# Patient Record
Sex: Male | Born: 1985 | Race: White | Hispanic: No | Marital: Single | Smoking: Current every day smoker
Health system: Western US, Academic
[De-identification: ages and names within clinical notes are randomized; demographics above are authoritative.]

## PROBLEM LIST (undated history)

## (undated) DIAGNOSIS — F1910 Other psychoactive substance abuse, uncomplicated: Secondary | ICD-10-CM

---
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
## 2011-04-10 ENCOUNTER — Encounter (HOSPITAL_COMMUNITY): Payer: Self-pay | Admitting: *Deleted

## 2011-04-10 ENCOUNTER — Emergency Department (HOSPITAL_COMMUNITY)
Admission: EM | Admit: 2011-04-10 | Discharge: 2011-04-11 | Disposition: A | Discharge status: Home / Self Care | Payer: No Typology Code available for payment source | Attending: Emergency Medicine | Admitting: Emergency Medicine

## 2011-04-10 ENCOUNTER — Emergency Department (HOSPITAL_COMMUNITY): Payer: No Typology Code available for payment source

## 2011-04-10 DIAGNOSIS — M542 Cervicalgia: Secondary | ICD-10-CM | POA: Insufficient documentation

## 2011-04-10 DIAGNOSIS — M79609 Pain in unspecified limb: Secondary | ICD-10-CM | POA: Insufficient documentation

## 2011-04-10 DIAGNOSIS — F1010 Alcohol abuse, uncomplicated: Secondary | ICD-10-CM | POA: Insufficient documentation

## 2011-04-10 DIAGNOSIS — R404 Transient alteration of awareness: Secondary | ICD-10-CM | POA: Insufficient documentation

## 2011-04-10 DIAGNOSIS — M546 Pain in thoracic spine: Secondary | ICD-10-CM | POA: Insufficient documentation

## 2011-04-10 LAB — ETHANOL: Alcohol, Ethyl (B): 42 mg/dL — ABNORMAL HIGH (ref 0–11)

## 2011-04-10 LAB — POCT I-STAT, CHEM 8
BUN: 13 mg/dL (ref 6–23)
Hemoglobin: 16.3 g/dL (ref 13.0–17.0)
Potassium: 3.2 mEq/L — ABNORMAL LOW (ref 3.5–5.1)
Sodium: 142 mEq/L (ref 135–145)
TCO2: 24 mmol/L (ref 0–100)

## 2011-04-10 LAB — CBC
HCT: 45.7 % (ref 39.0–52.0)
Hemoglobin: 16.7 g/dL (ref 13.0–17.0)
MCHC: 36.5 g/dL — ABNORMAL HIGH (ref 30.0–36.0)
WBC: 8.1 10*3/uL (ref 4.0–10.5)

## 2011-04-10 MED ORDER — SODIUM CHLORIDE 0.9 % IV BOLUS (SEPSIS)
1000.0000 mL | Freq: Once | INTRAVENOUS | Status: AC
  Administered 2011-04-10: 1000 mL via INTRAVENOUS

## 2011-04-10 MED ORDER — MORPHINE SULFATE 4 MG/ML IJ SOLN
4.0000 mg | Freq: Once | INTRAMUSCULAR | Status: AC
  Administered 2011-04-10: 4 mg via INTRAVENOUS
  Filled 2011-04-10: qty 1
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
## 2011-04-11 MED ORDER — OXYCODONE-ACETAMINOPHEN 5-325 MG PO TABS: 1.0000 | ORAL_TABLET | Freq: Four times a day (QID) | ORAL | Status: AC | PRN

## 2011-04-11 MED ORDER — IBUPROFEN 600 MG PO TABS: 600.0000 mg | ORAL_TABLET | Freq: Four times a day (QID) | ORAL | Status: AC | PRN

## 2011-04-11 NOTE — ED Provider Notes (Signed)
I saw and evaluated the patient, reviewed the resident's note and I agree with the findings and plan.  Cyndra Numbers, MD 04/11/11 [phone]
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
## 2013-07-28 ENCOUNTER — Encounter: Payer: Self-pay | Admitting: Family Medicine

## 2013-07-28 ENCOUNTER — Emergency Department
Admission: EM | Admit: 2013-07-28 | Discharge: 2013-07-28 | Disposition: A | Discharge status: Home / Self Care | Payer: MEDICAID

## 2013-07-28 ENCOUNTER — Emergency Department (EMERGENCY_DEPARTMENT_HOSPITAL): Payer: MEDICAID

## 2013-07-28 DIAGNOSIS — Z0389 Encounter for observation for other suspected diseases and conditions ruled out: Secondary | ICD-10-CM

## 2013-07-28 DIAGNOSIS — Y929 Unspecified place or not applicable: Secondary | ICD-10-CM | POA: Insufficient documentation

## 2013-07-28 DIAGNOSIS — R4182 Altered mental status, unspecified: Principal | ICD-10-CM | POA: Insufficient documentation

## 2013-07-28 DIAGNOSIS — T401X4A Poisoning by heroin, undetermined, initial encounter: Secondary | ICD-10-CM | POA: Insufficient documentation

## 2013-07-28 DIAGNOSIS — T394X1A Poisoning by antirheumatics, not elsewhere classified, accidental (unintentional), initial encounter: Secondary | ICD-10-CM | POA: Insufficient documentation

## 2013-07-28 DIAGNOSIS — T401X1A Poisoning by heroin, accidental (unintentional), initial encounter: Secondary | ICD-10-CM | POA: Insufficient documentation

## 2013-07-28 HISTORY — DX: Other psychoactive substance abuse, uncomplicated: F19.10

## 2013-07-28 LAB — CBC WITH DIFFERENTIAL
BASOPHILS % AUTO: 0.8 %
BASOPHILS ABS AUTO: 0.1 10*3/uL (ref 0–0.2)
EOSINOPHIL % AUTO: 0.9 %
EOSINOPHIL ABS AUTO: 0.1 10*3/uL (ref 0–0.5)
HEMATOCRIT: 38.8 % — AB (ref 40–52)
HEMOGLOBIN: 13 g/dL — AB (ref 14.0–18.0)
LYMPHOCYTE ABS AUTO: 1.8 10*3/uL (ref 1.0–4.8)
LYMPHOCYTES % AUTO: 23.7 %
MCH: 29.6 pg (ref 27–33)
MCHC: 33.4 % (ref 32–36)
MCV: 88.7 UM3 (ref 80–100)
MONOCYTES % AUTO: 8.4 %
MONOCYTES ABS AUTO: 0.6 10*3/uL (ref 0.1–0.8)
MPV: 7.6 UM3 (ref 6.8–10.0)
NEUTROPHIL ABS AUTO: 5.1 10*3/uL (ref 1.80–7.70)
NEUTROPHILS % AUTO: 66.2 %
PLATELET COUNT: 274 10*3/uL (ref 130–400)
RDW: 15.1 U — AB (ref 0–14.7)
RED CELL COUNT: 4.38 10*6/uL — AB (ref 4.5–5.9)
WHITE BLOOD CELL COUNT: 7.8 10*3/uL (ref 4.5–11.0)

## 2013-07-28 LAB — BASIC METABOLIC PANEL
CALCIUM: 8.8 mg/dL (ref 8.6–10.5)
CARBON DIOXIDE TOTAL: 26 meq/L (ref 24–32)
CHLORIDE: 107 meq/L (ref 95–110)
CREATININE BLOOD: 0.86 mg/dL (ref 0.44–1.27)
GLUCOSE: 107 mg/dL — AB (ref 70–99)
POTASSIUM: 3.6 meq/L (ref 3.3–5.0)
SODIUM: 139 meq/L (ref 135–145)
UREA NITROGEN, BLOOD (BUN): 12 mg/dL (ref 8–22)

## 2013-07-28 LAB — SALICYLATE,BLOOD: SALICYLATE,BLOOD: NEGATIVE mg/dL

## 2013-07-28 LAB — HEPATIC FUNCTION PANEL
ALANINE TRANSFERASE (ALT): 36 U/L (ref 6–63)
ALBUMIN: 4.3 g/dL (ref 3.4–4.8)
ALKALINE PHOSPHATASE (ALP): 81 U/L (ref 35–115)
ASPARTATE TRANSAMINASE (AST): 29 U/L (ref 15–43)
BILIRUBIN DIRECT: 0.2 mg/dL (ref 0.0–0.2)
BILIRUBIN TOTAL: 1.1 mg/dL (ref 0.3–1.3)
PROTEIN: 7.1 g/dL (ref 6.3–8.3)

## 2013-07-28 LAB — ACETAMINOPHEN

## 2013-07-28 MED ORDER — NACL 0.9% IV BOLUS - DURATION REQ
1000.0000 mL | Freq: Once | INTRAVENOUS | Status: DC
Start: 2013-07-28 — End: 2013-07-28

## 2013-07-28 NOTE — ED Triage Note (Signed)
Pt drowsy, biba s/p took unk amount of heroin and went unresponsive.  Pt given narcan 2mg  with +results.  Pt still drowsy.  To room.

## 2013-07-28 NOTE — ED Nursing Note (Signed)
Assumed care and report from carol for shift relief. He is awake, talkative and significant other at bedside. Says he is tired and behind on sleep. Requesting something to eat/drink.

## 2013-07-28 NOTE — ED Nursing Note (Signed)
Patients girlfriend here and says they live in a car and that her mom is going to pay for his re hab tomorrow

## 2013-07-28 NOTE — ED Nursing Note (Signed)
Report from schwapna rn.  Patient is awake and alert, perla 2mm, reactive, oriented states he is out of jail x 2 days and homeless.  He has been doing heroin x 2 days
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
## 2013-07-28 NOTE — ED Nursing Note (Signed)
Sleeping, color is good and sats 99% arouses easily to voice, has been taking po ice water

## 2013-07-28 NOTE — ED Nursing Note (Signed)
Stable, alert, ambulatory and steady gait with normal mentation and attentiveness. Leaving with significant other.

## 2013-07-28 NOTE — ED Nursing Note (Signed)
Patient voids 300cc dark yellow urine, ua tox to the lab, taking po water well, dozes at times

## 2013-07-28 NOTE — ED Nursing Note (Signed)
12 lead ekg done
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
## 2013-07-29 LAB — UR DRUGS COMPREHENSIVE
AMPHETAMINE SCREEN, URINE: POSITIVE ng/mL — AB
BARBITURATES SCREEN, URINE: NEGATIVE ng/mL
BENZODIAZEPINES SCREEN, URINE: NEGATIVE ng/mL
COCAINE METABOLITE SCRN, URINE: NEGATIVE ng/mL
OPIATES SCREEN, URINE: POSITIVE ng/mL — AB
UR DRUGS BY GC: POSITIVE — AB

## 2013-07-29 LAB — URINE GC CONFIRMATION

## 2013-07-29 LAB — URINE GCMS CONFIRMATION

## 2013-07-30 DIAGNOSIS — R000 Tachycardia, unspecified: Secondary | ICD-10-CM

## 2013-07-30 DIAGNOSIS — R9431 Abnormal electrocardiogram [ECG] [EKG]: Secondary | ICD-10-CM

## 2013-07-30 LAB — ELECTROCARDIOGRAM WITH RHYTHM STRIP: QTC: 411

## 2014-06-27 ENCOUNTER — Encounter (HOSPITAL_COMMUNITY): Payer: Self-pay | Admitting: Emergency Medicine

## 2014-06-27 ENCOUNTER — Emergency Department (HOSPITAL_COMMUNITY): Payer: Worker's Compensation

## 2014-06-27 ENCOUNTER — Inpatient Hospital Stay (HOSPITAL_COMMUNITY)
Admission: EM | Admit: 2014-06-27 | Discharge: 2014-06-30 | DRG: 187 | Disposition: A | Discharge status: Home / Self Care | Payer: Worker's Compensation | Attending: General Surgery | Admitting: General Surgery

## 2014-06-27 DIAGNOSIS — S25802A Unspecified injury of other blood vessels of thorax, left side, initial encounter: Secondary | ICD-10-CM | POA: Diagnosis present

## 2014-06-27 DIAGNOSIS — J942 Hemothorax: Secondary | ICD-10-CM | POA: Diagnosis present

## 2014-06-27 DIAGNOSIS — Z23 Encounter for immunization: Secondary | ICD-10-CM | POA: Diagnosis not present

## 2014-06-27 DIAGNOSIS — R1110 Vomiting, unspecified: Secondary | ICD-10-CM | POA: Diagnosis present

## 2014-06-27 DIAGNOSIS — J95812 Postprocedural air leak: Secondary | ICD-10-CM | POA: Diagnosis not present

## 2014-06-27 DIAGNOSIS — J9811 Atelectasis: Secondary | ICD-10-CM | POA: Diagnosis present

## 2014-06-27 DIAGNOSIS — D62 Acute posthemorrhagic anemia: Secondary | ICD-10-CM | POA: Diagnosis present

## 2014-06-27 DIAGNOSIS — I517 Cardiomegaly: Secondary | ICD-10-CM | POA: Diagnosis present

## 2014-06-27 DIAGNOSIS — S270XXA Traumatic pneumothorax, initial encounter: Secondary | ICD-10-CM | POA: Diagnosis present

## 2014-06-27 DIAGNOSIS — J939 Pneumothorax, unspecified: Secondary | ICD-10-CM

## 2014-06-27 DIAGNOSIS — S21112A Laceration without foreign body of left front wall of thorax without penetration into thoracic cavity, initial encounter: Secondary | ICD-10-CM | POA: Diagnosis present

## 2014-06-27 DIAGNOSIS — S21119A Laceration without foreign body of unspecified front wall of thorax without penetration into thoracic cavity, initial encounter: Secondary | ICD-10-CM | POA: Diagnosis present

## 2014-06-27 DIAGNOSIS — T797XXA Traumatic subcutaneous emphysema, initial encounter: Secondary | ICD-10-CM | POA: Diagnosis present

## 2014-06-27 DIAGNOSIS — S272XXA Traumatic hemopneumothorax, initial encounter: Secondary | ICD-10-CM

## 2014-06-27 DIAGNOSIS — Z4682 Encounter for fitting and adjustment of non-vascular catheter: Secondary | ICD-10-CM

## 2014-06-27 DIAGNOSIS — S2190XA Unspecified open wound of unspecified part of thorax, initial encounter: Secondary | ICD-10-CM | POA: Diagnosis present

## 2014-06-27 LAB — COMPREHENSIVE METABOLIC PANEL
ALBUMIN: 4.1 g/dL (ref 3.5–5.0)
ALT: 20 U/L (ref 17–63)
AST: 27 U/L (ref 15–41)
Alkaline Phosphatase: 82 U/L (ref 38–126)
Anion gap: 13 (ref 5–15)
BILIRUBIN TOTAL: 1 mg/dL (ref 0.3–1.2)
BUN: 14 mg/dL (ref 6–20)
CO2: 22 mmol/L (ref 22–32)
CREATININE: 1.13 mg/dL (ref 0.61–1.24)
Calcium: 9.5 mg/dL (ref 8.9–10.3)
Chloride: 107 mmol/L (ref 101–111)
GFR calc Af Amer: >60 mL/min (ref >60)
GFR calc non Af Amer: >60 mL/min (ref >60)
Glucose, Bld: 114 mg/dL — ABNORMAL HIGH (ref 70–99)
Potassium: 3.6 mmol/L (ref 3.5–5.1)
Sodium: 142 mmol/L (ref 135–145)
Total Protein: 7 g/dL (ref 6.5–8.1)

## 2014-06-27 LAB — PREPARE FRESH FROZEN PLASMA
UNIT DIVISION: 0
Unit division: 0

## 2014-06-27 LAB — CBC
HEMATOCRIT: 44.5 % (ref 39.0–52.0)
Hemoglobin: 15.2 g/dL (ref 13.0–17.0)
MCH: 29.9 pg (ref 26.0–34.0)
MCHC: 34.2 g/dL (ref 30.0–36.0)
MCV: 87.4 fL (ref 78.0–100.0)
Platelets: 197 10*3/uL (ref 150–400)
RBC: 5.09 MIL/uL (ref 4.22–5.81)
RDW: 12.2 % (ref 11.5–15.5)
WBC: 8.4 10*3/uL (ref 4.0–10.5)

## 2014-06-27 LAB — I-STAT CHEM 8, ED
BUN: 16 mg/dL (ref 6–20)
CALCIUM ION: 1.24 mmol/L — AB (ref 1.12–1.23)
Chloride: 104 mmol/L (ref 101–111)
Creatinine, Ser: 1 mg/dL (ref 0.61–1.24)
Glucose, Bld: 110 mg/dL — ABNORMAL HIGH (ref 70–99)
HEMATOCRIT: 46 % (ref 39.0–52.0)
HEMOGLOBIN: 15.6 g/dL (ref 13.0–17.0)
Potassium: 3.6 mmol/L (ref 3.5–5.1)
Sodium: 145 mmol/L (ref 135–145)
TCO2: 21 mmol/L (ref 0–100)

## 2014-06-27 LAB — ETHANOL: Alcohol, Ethyl (B): <5 mg/dL (ref <5)

## 2014-06-27 LAB — I-STAT CG4 LACTIC ACID, ED: LACTIC ACID, VENOUS: 4.03 mmol/L — AB (ref 0.5–2.0)

## 2014-06-27 LAB — PROTIME-INR
INR: 1.09 (ref 0.00–1.49)
PROTHROMBIN TIME: 14.3 s (ref 11.6–15.2)

## 2014-06-27 LAB — ABO/RH: ABO/RH(D): O POS

## 2014-06-27 LAB — CDS SEROLOGY

## 2014-06-27 MED ORDER — FENTANYL CITRATE (PF) 100 MCG/2ML IJ SOLN
50.0000 ug | Freq: Once | INTRAMUSCULAR | Status: AC
  Administered 2014-06-27: 50 ug via INTRAVENOUS

## 2014-06-27 MED ORDER — HYDROMORPHONE HCL 1 MG/ML IJ SOLN
INTRAMUSCULAR | Status: AC
  Filled 2014-06-27: qty 1

## 2014-06-27 MED ORDER — FENTANYL CITRATE (PF) 100 MCG/2ML IJ SOLN
INTRAMUSCULAR | Status: AC
  Administered 2014-06-27: 100 ug via INTRAVENOUS
  Filled 2014-06-27: qty 2

## 2014-06-27 MED ORDER — MIDAZOLAM HCL 2 MG/2ML IJ SOLN
2.0000 mg | Freq: Once | INTRAMUSCULAR | Status: AC
  Administered 2014-06-27: 2 mg via INTRAVENOUS

## 2014-06-27 MED ORDER — PANTOPRAZOLE SODIUM 40 MG PO TBEC
40.0000 mg | DELAYED_RELEASE_TABLET | Freq: Every day | ORAL | Status: DC
  Administered 2014-06-27 – 2014-06-28 (×2): 40 mg via ORAL
  Filled 2014-06-27 (×2): qty 1

## 2014-06-27 MED ORDER — LIDOCAINE-EPINEPHRINE 1 %-1:100000 IJ SOLN
INTRAMUSCULAR | Status: AC
  Filled 2014-06-27: qty 1

## 2014-06-27 MED ORDER — HYDROMORPHONE HCL 1 MG/ML IJ SOLN
1.0000 mg | INTRAMUSCULAR | Status: DC | PRN
  Administered 2014-06-27: 1 mg via INTRAVENOUS
  Administered 2014-06-28 (×2): 2 mg via INTRAVENOUS
  Filled 2014-06-27 (×2): qty 2

## 2014-06-27 MED ORDER — MIDAZOLAM HCL 2 MG/2ML IJ SOLN
INTRAMUSCULAR | Status: AC
  Administered 2014-06-27: 2 mg via INTRAVENOUS
  Filled 2014-06-27: qty 2

## 2014-06-27 MED ORDER — OXYCODONE HCL 5 MG PO TABS: 5.0000 mg | ORAL_TABLET | ORAL | Status: DC | PRN

## 2014-06-27 MED ORDER — CEFAZOLIN SODIUM 1-5 GM-% IV SOLN
1.0000 g | Freq: Three times a day (TID) | INTRAVENOUS | Status: DC
  Administered 2014-06-28 – 2014-06-29 (×4): 1 g via INTRAVENOUS
  Filled 2014-06-27 (×6): qty 50

## 2014-06-27 MED ORDER — FENTANYL CITRATE (PF) 100 MCG/2ML IJ SOLN
INTRAMUSCULAR | Status: AC
Start: 2014-06-27 — End: 2014-06-27
  Administered 2014-06-27: 50 ug via INTRAVENOUS
  Filled 2014-06-27: qty 2

## 2014-06-27 MED ORDER — PANTOPRAZOLE SODIUM 40 MG IV SOLR
40.0000 mg | Freq: Every day | INTRAVENOUS | Status: DC
  Filled 2014-06-27 (×2): qty 40

## 2014-06-27 MED ORDER — CEFAZOLIN SODIUM-DEXTROSE 2-3 GM-% IV SOLR
2.0000 g | Freq: Once | INTRAVENOUS | Status: AC
  Administered 2014-06-27: 2 g via INTRAVENOUS

## 2014-06-27 MED ORDER — FENTANYL CITRATE (PF) 100 MCG/2ML IJ SOLN
100.0000 ug | Freq: Once | INTRAMUSCULAR | Status: AC
  Administered 2014-06-27: 100 ug via INTRAVENOUS

## 2014-06-27 MED ORDER — TETANUS-DIPHTH-ACELL PERTUSSIS 5-2.5-18.5 LF-MCG/0.5 IM SUSP
0.5000 mL | Freq: Once | INTRAMUSCULAR | Status: AC
  Administered 2014-06-27: 0.5 mL via INTRAMUSCULAR

## 2014-06-27 MED ORDER — BISACODYL 10 MG RE SUPP: 10.0000 mg | Freq: Every day | RECTAL | Status: DC | PRN

## 2014-06-27 MED ORDER — FENTANYL CITRATE (PF) 100 MCG/2ML IJ SOLN
INTRAMUSCULAR | Status: AC
  Filled 2014-06-27: qty 2

## 2014-06-27 MED ORDER — LIDOCAINE HCL (PF) 1 % IJ SOLN
INTRAMUSCULAR | Status: AC
  Administered 2014-06-27: 30 mL
  Filled 2014-06-27: qty 30

## 2014-06-27 MED ORDER — FENTANYL CITRATE (PF) 100 MCG/2ML IJ SOLN
INTRAMUSCULAR | Status: AC | PRN
  Administered 2014-06-27: 100 ug via INTRAVENOUS

## 2014-06-27 MED ORDER — ONDANSETRON HCL 4 MG PO TABS: 4.0000 mg | ORAL_TABLET | Freq: Four times a day (QID) | ORAL | Status: DC | PRN

## 2014-06-27 MED ORDER — ONDANSETRON HCL 4 MG/2ML IJ SOLN
4.0000 mg | Freq: Four times a day (QID) | INTRAMUSCULAR | Status: DC | PRN
  Administered 2014-06-27 – 2014-06-29 (×3): 4 mg via INTRAVENOUS
  Filled 2014-06-27 (×3): qty 2

## 2014-06-27 MED ORDER — OXYCODONE HCL 5 MG PO TABS
10.0000 mg | ORAL_TABLET | ORAL | Status: DC | PRN
  Administered 2014-06-27 – 2014-06-28 (×4): 10 mg via ORAL
  Filled 2014-06-27 (×4): qty 2

## 2014-06-27 MED ORDER — SODIUM CHLORIDE 0.9 % IV SOLN
INTRAVENOUS | Status: DC
  Administered 2014-06-27: 50 mL/h via INTRAVENOUS

## 2014-06-27 MED ORDER — LIDOCAINE-EPINEPHRINE (PF) 2 %-1:200000 IJ SOLN
INTRAMUSCULAR | Status: AC
  Administered 2014-06-27: 20 mL
  Filled 2014-06-27: qty 20

## 2014-06-27 MED ORDER — DOCUSATE SODIUM 100 MG PO CAPS
100.0000 mg | ORAL_CAPSULE | Freq: Two times a day (BID) | ORAL | Status: DC
  Administered 2014-06-28 – 2014-06-30 (×5): 100 mg via ORAL
  Filled 2014-06-27 (×6): qty 1

## 2014-06-27 MED ORDER — LIDOCAINE-EPINEPHRINE (PF) 2 %-1:200000 IJ SOLN
INTRAMUSCULAR | Status: AC
  Filled 2014-06-27: qty 20

## 2014-06-27 MED ORDER — CEFAZOLIN SODIUM-DEXTROSE 2-3 GM-% IV SOLR
INTRAVENOUS | Status: AC
  Administered 2014-06-27: 2 g via INTRAVENOUS
  Filled 2014-06-27: qty 50

## 2014-06-27 NOTE — Procedures (Signed)
Chest Tube Insertion Procedure Note  Indications:  Clinically significant Pneumothorax and Hemothorax  Pre-operative Diagnosis: Pneumothorax and Hemothorax  Post-operative Diagnosis: Pneumothorax and Hemothorax  Procedure Details  Informed consent was obtained for the procedure, including sedation.  Risks of lung perforation, hemorrhage, arrhythmia, and adverse drug reaction were discussed.   After sterile skin prep, using standard technique, a 32 French tube was placed in the left lateral 6th rib space.  Findings: 400 ml of sanguinous fluid obtained  Estimated Blood Loss:          Specimens:  None              Complications:  None; patient tolerated the procedure well.         Disposition: Remained in ED, Trauma room B, Post-procedure CXT shows re-expansion         Condition: stable  Attending Attestation: I performed the procedure.

## 2014-06-27 NOTE — ED Notes (Signed)
Patient brother has wallet and keys. Clothes and shoes given to another family member.
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
## 2014-06-27 NOTE — ED Notes (Signed)
Family at beside. Family given emotional support. 
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
## 2014-06-27 NOTE — ED Notes (Signed)
Warm blanket applied

## 2014-06-27 NOTE — ED Notes (Signed)
Surgeon at bedside inserting Ricardo Hart chest tube.

## 2014-06-27 NOTE — ED Notes (Signed)
Mother at bedside.

## 2014-06-28 ENCOUNTER — Inpatient Hospital Stay (HOSPITAL_COMMUNITY): Payer: Worker's Compensation

## 2014-06-28 LAB — BASIC METABOLIC PANEL
Anion gap: 9 (ref 5–15)
BUN: 11 mg/dL (ref 6–20)
CALCIUM: 8.9 mg/dL (ref 8.9–10.3)
CO2: 24 mmol/L (ref 22–32)
Chloride: 107 mmol/L (ref 101–111)
Creatinine, Ser: 0.99 mg/dL (ref 0.61–1.24)
GFR calc Af Amer: >60 mL/min (ref >60)
GLUCOSE: 110 mg/dL — AB (ref 65–99)
Potassium: 4.2 mmol/L (ref 3.5–5.1)
Sodium: 140 mmol/L (ref 135–145)

## 2014-06-28 LAB — TYPE AND SCREEN
ABO/RH(D): O POS
ANTIBODY SCREEN: NEGATIVE
UNIT DIVISION: 0
Unit division: 0

## 2014-06-28 LAB — CBC
HEMATOCRIT: 41.3 % (ref 39.0–52.0)
Hemoglobin: 13.9 g/dL (ref 13.0–17.0)
MCH: 30 pg (ref 26.0–34.0)
MCHC: 33.7 g/dL (ref 30.0–36.0)
MCV: 89.2 fL (ref 78.0–100.0)
Platelets: 187 10*3/uL (ref 150–400)
RBC: 4.63 MIL/uL (ref 4.22–5.81)
RDW: 12.5 % (ref 11.5–15.5)
WBC: 16.9 10*3/uL — AB (ref 4.0–10.5)

## 2014-06-28 LAB — MRSA PCR SCREENING: MRSA by PCR: NEGATIVE

## 2014-06-28 MED ORDER — MORPHINE SULFATE 2 MG/ML IJ SOLN
2.0000 mg | INTRAMUSCULAR | Status: DC | PRN
  Administered 2014-06-28 (×2): 2 mg via INTRAVENOUS
  Administered 2014-06-28 – 2014-06-29 (×3): 4 mg via INTRAVENOUS
  Filled 2014-06-28: qty 2
  Filled 2014-06-28 (×2): qty 1
  Filled 2014-06-28 (×2): qty 2
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
## 2014-06-28 NOTE — Progress Notes (Signed)
UR completed.  No HH needs anticipated. CM to continue to follow in case d/c needs are identified.   Carlyle LipaMichelle Kelcee Bjorn, RN BSN MHA CCM Trauma/Neuro ICU Case Manager [phone]

## 2014-06-29 ENCOUNTER — Inpatient Hospital Stay (HOSPITAL_COMMUNITY): Payer: Worker's Compensation

## 2014-06-29 DIAGNOSIS — S21119A Laceration without foreign body of unspecified front wall of thorax without penetration into thoracic cavity, initial encounter: Secondary | ICD-10-CM | POA: Diagnosis present

## 2014-06-29 LAB — CBC
HCT: 41.5 % (ref 39.0–52.0)
Hemoglobin: 13.9 g/dL (ref 13.0–17.0)
MCH: 30 pg (ref 26.0–34.0)
MCHC: 33.5 g/dL (ref 30.0–36.0)
MCV: 89.6 fL (ref 78.0–100.0)
Platelets: 199 10*3/uL (ref 150–400)
RBC: 4.63 MIL/uL (ref 4.22–5.81)
RDW: 12.5 % (ref 11.5–15.5)
WBC: 12.7 10*3/uL — ABNORMAL HIGH (ref 4.0–10.5)

## 2014-06-29 MED ORDER — MORPHINE SULFATE 2 MG/ML IJ SOLN
2.0000 mg | INTRAMUSCULAR | Status: DC | PRN
  Administered 2014-06-29 – 2014-06-30 (×4): 2 mg via INTRAVENOUS
  Filled 2014-06-29 (×4): qty 1

## 2014-06-29 MED ORDER — POLYETHYLENE GLYCOL 3350 17 G PO PACK
17.0000 g | PACK | Freq: Every day | ORAL | Status: DC
  Administered 2014-06-29 – 2014-06-30 (×2): 17 g via ORAL
  Filled 2014-06-29 (×2): qty 1

## 2014-06-29 MED ORDER — METHOCARBAMOL 500 MG PO TABS
1000.0000 mg | ORAL_TABLET | Freq: Four times a day (QID) | ORAL | Status: DC
  Administered 2014-06-29 – 2014-06-30 (×6): 1000 mg via ORAL
  Filled 2014-06-29 (×6): qty 2

## 2014-06-29 MED ORDER — OXYCODONE HCL 5 MG PO TABS
10.0000 mg | ORAL_TABLET | ORAL | Status: DC | PRN
  Administered 2014-06-29 – 2014-06-30 (×3): 20 mg via ORAL
  Filled 2014-06-29 (×3): qty 4
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
## 2014-06-30 ENCOUNTER — Inpatient Hospital Stay (HOSPITAL_COMMUNITY): Payer: Worker's Compensation

## 2014-06-30 LAB — BLOOD PRODUCT ORDER (VERBAL) VERIFICATION

## 2014-06-30 MED ORDER — OXYCODONE HCL 10 MG PO TABS: 10.0000 mg | ORAL_TABLET | ORAL | Status: DC | PRN
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
## 2014-06-30 NOTE — Discharge Instructions (Signed)
Wash wounds daily in shower with soap and water. Do not soak. Apply antibiotic ointment (e.g. Neosporin) twice daily and as needed to keep moist. Cover with dry dressing.  No driving while taking oxycodone.  Do not remove dressing from chest tube site until next Tuesday

## 2014-07-03 ENCOUNTER — Encounter (HOSPITAL_COMMUNITY): Payer: Self-pay | Admitting: *Deleted
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
## 2014-09-25 DIAGNOSIS — S61412A Laceration without foreign body of left hand, initial encounter: Secondary | ICD-10-CM | POA: Insufficient documentation

## 2014-09-25 DIAGNOSIS — Y9289 Other specified places as the place of occurrence of the external cause: Secondary | ICD-10-CM | POA: Insufficient documentation

## 2014-09-25 DIAGNOSIS — Y998 Other external cause status: Secondary | ICD-10-CM | POA: Insufficient documentation

## 2014-09-25 DIAGNOSIS — W25XXXA Contact with sharp glass, initial encounter: Secondary | ICD-10-CM | POA: Insufficient documentation

## 2014-09-25 DIAGNOSIS — Y9389 Activity, other specified: Secondary | ICD-10-CM | POA: Insufficient documentation

## 2014-09-26 ENCOUNTER — Encounter (HOSPITAL_COMMUNITY): Payer: Self-pay | Admitting: Emergency Medicine

## 2014-09-26 ENCOUNTER — Emergency Department (HOSPITAL_COMMUNITY)
Admission: EM | Admit: 2014-09-26 | Discharge: 2014-09-26 | Disposition: A | Discharge status: Home / Self Care | Payer: Self-pay | Attending: Emergency Medicine | Admitting: Emergency Medicine

## 2014-09-26 DIAGNOSIS — S61412A Laceration without foreign body of left hand, initial encounter: Secondary | ICD-10-CM

## 2014-09-26 MED ORDER — LIDOCAINE-EPINEPHRINE 1 %-1:100000 IJ SOLN
10.0000 mL | Freq: Once | INTRAMUSCULAR | Status: AC
  Administered 2014-09-26: 2 mL
  Filled 2014-09-26: qty 1
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
## 2016-04-06 IMAGING — CR DG CHEST 1V PORT
1 series · 1 of 1 positions shown · non-contrast
Comparison: Radiograph 06/30/2014

CLINICAL DATA: Chest tube removal

EXAM:
PORTABLE CHEST - 1 VIEW

[AP]
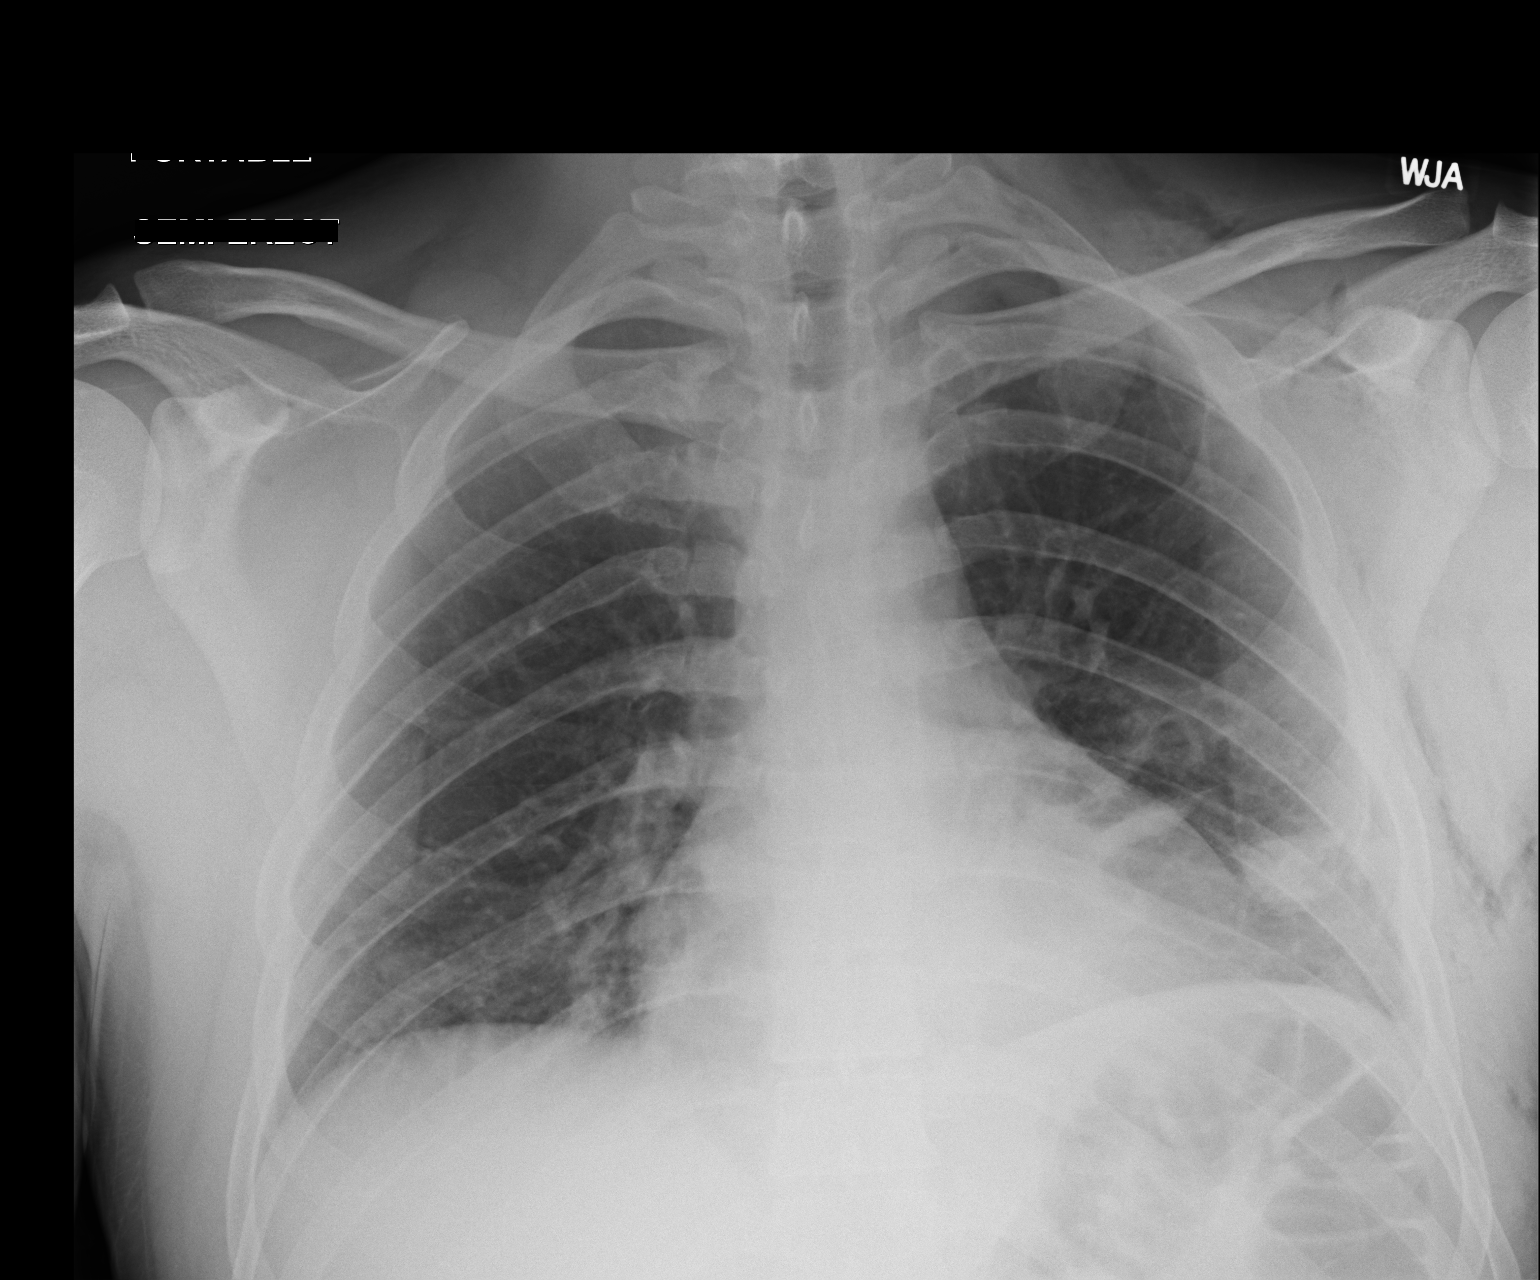

[1 of 1 positions shown; findings below may reference images not displayed]

FINDINGS: Normal cardiac silhouette. Interval removal of the left chest tube.
There is a small left apical pneumothorax measuring 5 mm from the
pleural edge at the apical chest wall. This is not changed in volume
compared to prior. Small amount of subcutaneous chest wall gas is
again noted. Small contusion versus atelectasis in the left lower
lobe at the chest tube site.
IMPRESSION: Interval removal of left chest tube with stable small left apical
pneumothorax.

Small contusion versus atelectasis in the left lower lobe.
